# Patient Record
Sex: Female | Born: 1945 | Race: White | Hispanic: No | Smoking: Never smoker
Health system: Southern US, Community
[De-identification: ages and names within clinical notes are randomized; demographics above are authoritative.]

## PROBLEM LIST (undated history)

## (undated) DIAGNOSIS — I10 Essential (primary) hypertension: Secondary | ICD-10-CM

## (undated) DIAGNOSIS — E079 Disorder of thyroid, unspecified: Secondary | ICD-10-CM

## (undated) DIAGNOSIS — E7800 Pure hypercholesterolemia, unspecified: Secondary | ICD-10-CM

## (undated) DIAGNOSIS — E119 Type 2 diabetes mellitus without complications: Secondary | ICD-10-CM

## (undated) HISTORY — PX: ABDOMINAL HYSTERECTOMY: SHX81

## (undated) HISTORY — PX: APPENDECTOMY: SHX54

## (undated) HISTORY — PX: CHOLECYSTECTOMY: SHX55

---

## 2018-01-23 ENCOUNTER — Emergency Department (HOSPITAL_COMMUNITY): Payer: Medicare Other

## 2018-01-23 ENCOUNTER — Emergency Department (HOSPITAL_COMMUNITY)
Admission: EM | Admit: 2018-01-23 | Discharge: 2018-01-23 | Disposition: A | Discharge status: Home / Self Care | Payer: Medicare Other | Attending: Emergency Medicine | Admitting: Emergency Medicine

## 2018-01-23 ENCOUNTER — Encounter (HOSPITAL_COMMUNITY): Payer: Self-pay | Admitting: Emergency Medicine

## 2018-01-23 ENCOUNTER — Other Ambulatory Visit: Payer: Self-pay

## 2018-01-23 DIAGNOSIS — G4489 Other headache syndrome: Secondary | ICD-10-CM | POA: Diagnosis not present

## 2018-01-23 DIAGNOSIS — E119 Type 2 diabetes mellitus without complications: Secondary | ICD-10-CM | POA: Insufficient documentation

## 2018-01-23 DIAGNOSIS — I10 Essential (primary) hypertension: Secondary | ICD-10-CM | POA: Diagnosis not present

## 2018-01-23 DIAGNOSIS — R51 Headache: Secondary | ICD-10-CM | POA: Diagnosis present

## 2018-01-23 HISTORY — DX: Type 2 diabetes mellitus without complications: E11.9

## 2018-01-23 HISTORY — DX: Essential (primary) hypertension: I10

## 2018-01-23 HISTORY — DX: Pure hypercholesterolemia, unspecified: E78.00

## 2018-01-23 HISTORY — DX: Disorder of thyroid, unspecified: E07.9

## 2018-01-23 MED ORDER — HYDROCODONE-ACETAMINOPHEN 5-325 MG PO TABS
2.0000 | ORAL_TABLET | Freq: Once | ORAL | Status: AC
  Administered 2018-01-23: 2 via ORAL
  Filled 2018-01-23: qty 2

## 2018-01-23 NOTE — ED Notes (Signed)
Pt requesting a quiet place to wait and requesting results from CT.  Notified Dr. Effie ShyWentz and moved pt to family room for Dr. Effie ShyWentz to talk to pt.

## 2018-01-23 NOTE — ED Notes (Signed)
Pt was discharged by previous shift RN

## 2018-01-23 NOTE — ED Provider Notes (Signed)
[facility]GENCY DEPARTMENT Provider Note   CSN: [id number] Arrival date & time: 01/23/18  1628     History   Chief Complaint Chief Complaint  Patient presents with  . Headache    HPI Holly Berger is a 72 y.o. female.  Patient evaluated by me, at 20: 20 p.m., in the family room, at the request of nursing, because the patient stated she wanted to leave, without waiting for routine examination in an examination room.  CT ordered from triage, by me, after request by nursing because her physician sent the patient here for a CT of her head.  She has had a headache persistent for 1 week since upper endoscopy was done.  She is using Aleve, "every 4 hours," without relief.  She denies fever, nausea, vomiting, chest pain, weakness or dizziness.  She saw her doctor earlier today and was advised to come for a CAT scan, before treatment was initiated.  She came here by private vehicle with a friend.  Similar headaches in the past but never lasted this long.  There are no other known modifying factors.  HPI  Past Medical History:  Diagnosis Date  . Diabetes mellitus without complication (HCC)   . Hypercholesteremia   . Hypertension   . Thyroid disease     There are no active problems to display for this patient.   Past Surgical History:  Procedure Laterality Date  . ABDOMINAL HYSTERECTOMY    . APPENDECTOMY    . CHOLECYSTECTOMY      OB History    Gravida Para Term Preterm AB Living   2 2 2     1    SAB TAB Ectopic Multiple Live Births                   Home Medications    Prior to Admission medications   Not on File    Family History Family History  Problem Relation Age of Onset  . Cerebrovascular Disease Father     Social History Social History   Tobacco Use  . Smoking status: Never Smoker  . Smokeless tobacco: Never Used  Substance Use Topics  . Alcohol use: No    Frequency: Never  . Drug use: No     Allergies   Patient has no known  allergies.   Review of Systems Review of Systems  All other systems reviewed and are negative.    Physical Exam Updated Vital Signs BP (!) 155/74 (BP Location: Right Arm)   Pulse 76   Temp 98.2 F (36.8 C) (Oral)   Resp 18   Ht 5\' 11"  (1.803 m)   Wt 95.3 kg (210 lb)   SpO2 99%   BMI 29.29 kg/m   Physical Exam  Constitutional: She is oriented to person, place, and time. She appears well-developed and well-nourished. She does not appear ill.  HENT:  Head: Normocephalic and atraumatic.  Eyes: Conjunctivae and EOM are normal. Pupils are equal, round, and reactive to light.  Neck: Normal range of motion and phonation normal. Neck supple. No neck rigidity.  No meningismus  Cardiovascular: Normal rate.  Pulmonary/Chest: Effort normal. She exhibits no tenderness.  Musculoskeletal: Normal range of motion.  Neurological: She is alert and oriented to person, place, and time. She exhibits normal muscle tone.  Skin: Skin is warm and dry.  Psychiatric: She has a normal mood and affect. Her behavior is normal. Judgment and thought content normal.  Nursing note and vitals reviewed.    ED Treatments /  Results  Labs (all labs ordered are listed, but only abnormal results are displayed) Labs Reviewed - No data to display  EKG  EKG Interpretation None       Radiology Ct Head Wo Contrast  Result Date: 01/23/2018 CLINICAL DATA:  Headache EXAM: CT HEAD WITHOUT CONTRAST TECHNIQUE: Contiguous axial images were obtained from the base of the skull through the vertex without intravenous contrast. COMPARISON:  None. FINDINGS: Brain: No mass lesion, intraparenchymal hemorrhage or extra-axial collection. No evidence of acute cortical infarct. Brain parenchyma and CSF-containing spaces are normal for age. Vascular: No hyperdense vessel or unexpected calcification. Skull: Normal visualized skull base, calvarium and extracranial soft tissues. Sinuses/Orbits: No sinus fluid levels or advanced  mucosal thickening. No mastoid effusion. Normal orbits. IMPRESSION: Normal head CT. Electronically Signed   By: Deatra Robinson M.D.   On: 01/23/2018 18:15    Procedures Procedures (including critical care time)  Medications Ordered in ED Medications  HYDROcodone-acetaminophen (NORCO/VICODIN) 5-325 MG per tablet 2 tablet (not administered)     Initial Impression / Assessment and Plan / ED Course  I have reviewed the triage vital signs and the nursing notes.  Pertinent labs & imaging results that were available during my care of the patient were reviewed by me and considered in my medical decision making (see chart for details).      Patient Vitals for the past 24 hrs:  BP Temp Temp src Pulse Resp SpO2 Height Weight  01/23/18 1731 - - - - - - 5\' 11"  (1.803 m) 95.3 kg (210 lb)  01/23/18 1730 (!) 155/74 98.2 F (36.8 C) Oral 76 18 99 % - -     8:28 PM Reevaluation with update and discussion. After initial assessment and treatment, an updated evaluation reveals patient was offered a pain reliever and accepted it.  Findings discussed with patient and all questions were answered. Mancel Bale      Final Clinical Impressions(s) / ED Diagnoses   Final diagnoses:  Other headache syndrome   Is nonspecific headache, with reassuring evaluation.  Doubt intracranial tumor, doubt intracranial bleeding, doubt serious bacterial infection or metabolic instability.  Nursing Notes Reviewed/ Care Coordinated Applicable Imaging Reviewed Interpretation of Laboratory Data incorporated into ED treatment  The patient appears reasonably screened and/or stabilized for discharge and I doubt any other medical condition or other [facility] requiring further screening, evaluation, or treatment in the ED at this time prior to discharge.  Plan: Home Medications-continue routine medications; Home Treatments-rest, fluids; return here if the recommended treatment, does not improve the symptoms; Recommended follow  up-PCP follow-up as needed.   ED Discharge Orders    None       Mancel Bale, MD 01/23/18 2051

## 2018-01-23 NOTE — Discharge Instructions (Signed)
Your brain scan, was normal.  Call your doctor in the morning to arrange for further care and treatment.

## 2018-01-23 NOTE — ED Triage Notes (Signed)
Patient reports she had an endoscopic exam last Wednesday. Woke from the procedure with a headache, has had one since.

## 2018-08-07 ENCOUNTER — Encounter: Payer: Self-pay | Admitting: Gastroenterology

## 2018-08-13 IMAGING — CT CT HEAD W/O CM
3 series · 16 of 47 positions shown, 19 images · non-contrast
Comparison: None.

CLINICAL DATA: Headache

EXAM:
CT HEAD WITHOUT CONTRAST
TECHNIQUE: Contiguous axial images were obtained from the base of the skull
through the vertex without intravenous contrast.

[Series 2: head wo · axial · 0.45mm/px · z∈[-589,-459]mm · 10 of 32 slices shown, 13 images]
[im 3/32  brain]
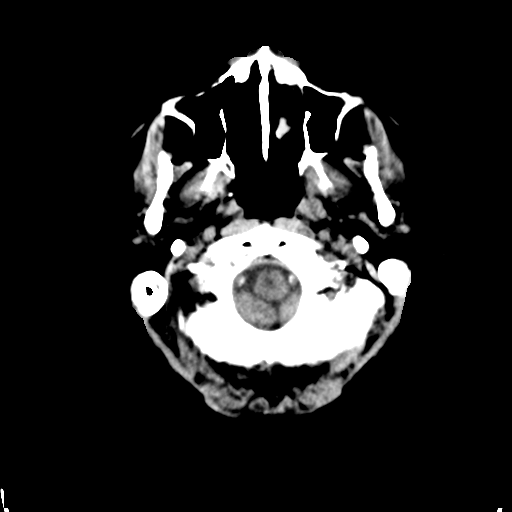
[im 3/32  bone]
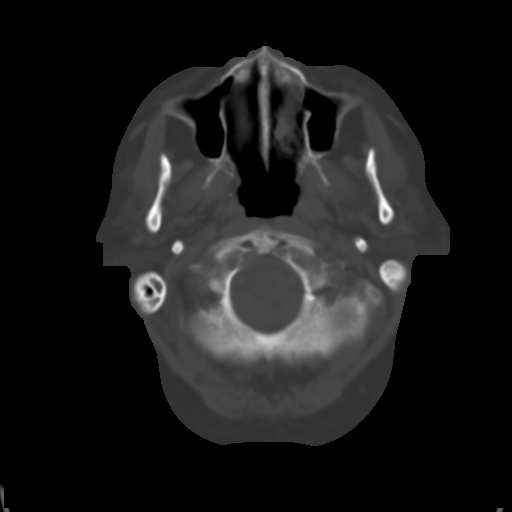
[im 6/32  brain]
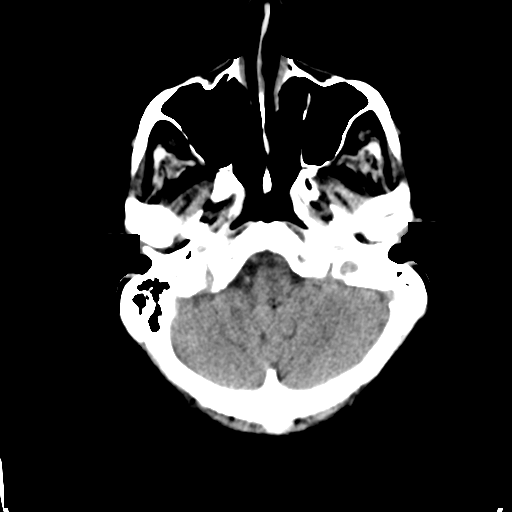
[im 9/32  brain]
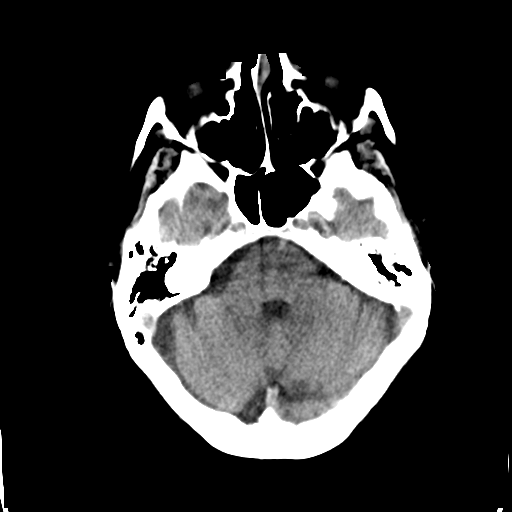
[im 11/32  brain]
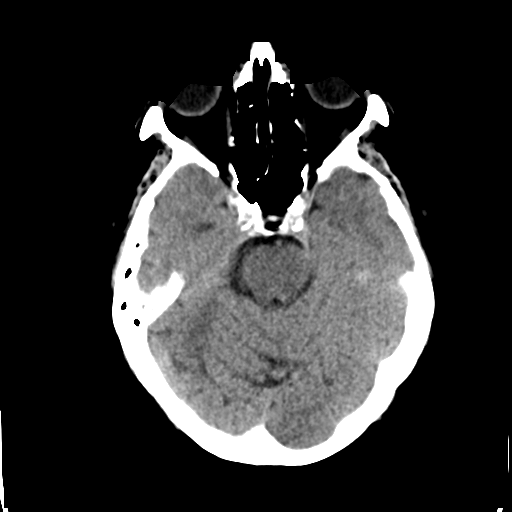
[im 14/32  brain]
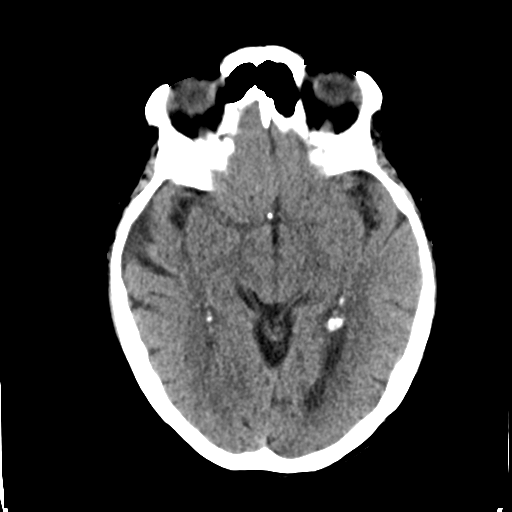
[im 14/32  bone]
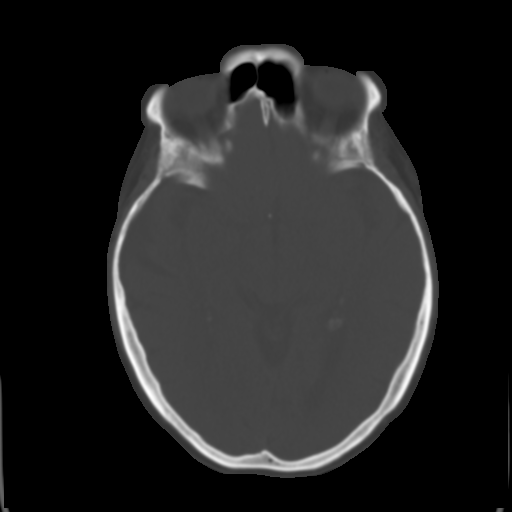
[im 18/32  brain]
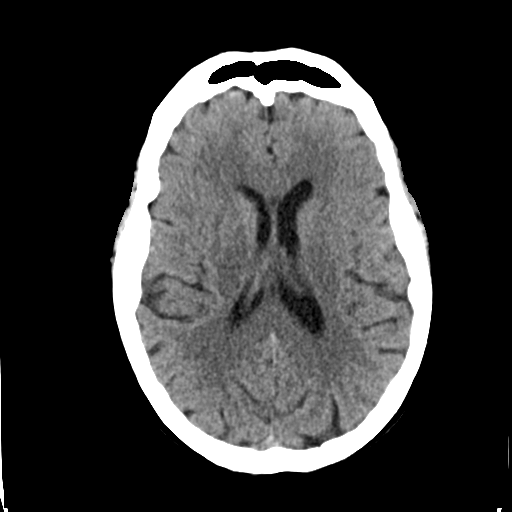
[im 21/32  brain]
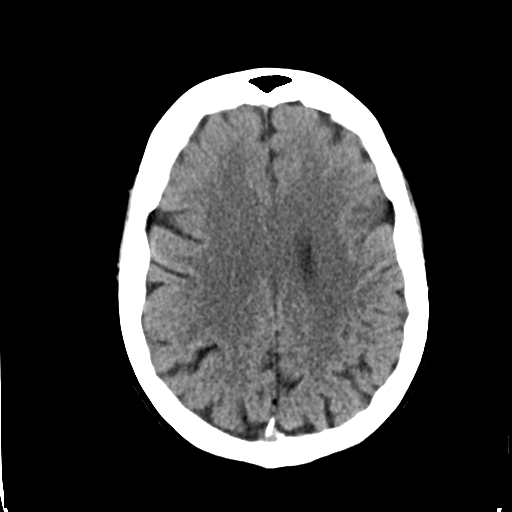
[im 24/32  brain]
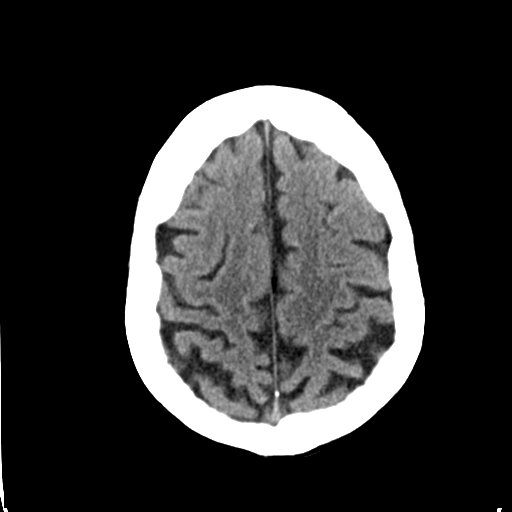
[im 26/32  brain]
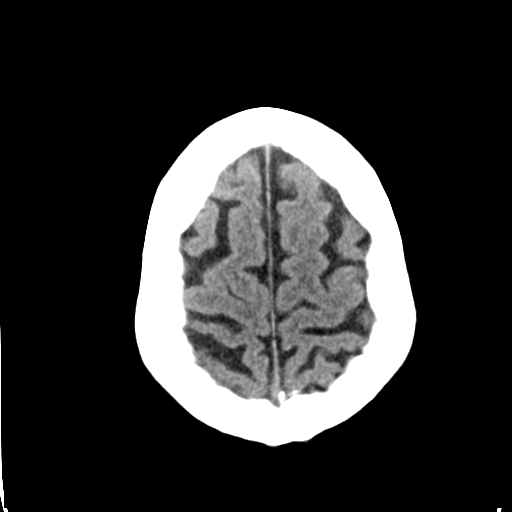
[im 26/32  bone]
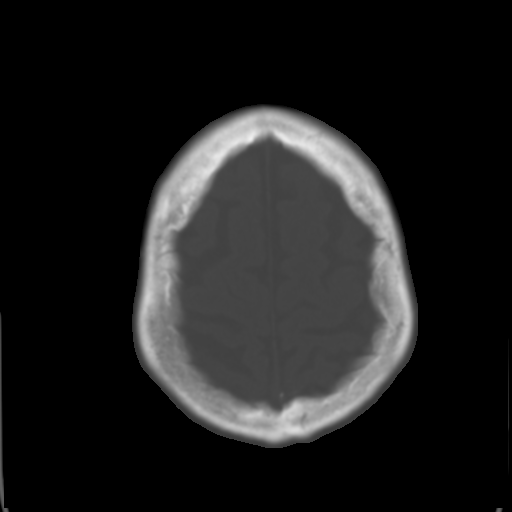
[im 29/32  brain]
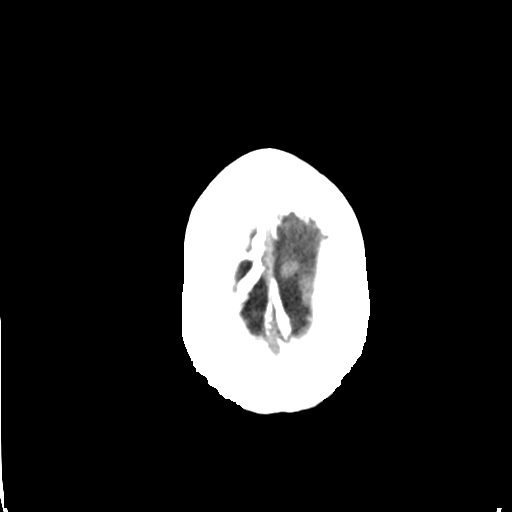

[Series 4: coronal soft tissue · coronal · 0.31mm/px · 3 of 71 slices shown]
[im 24/71  brain]
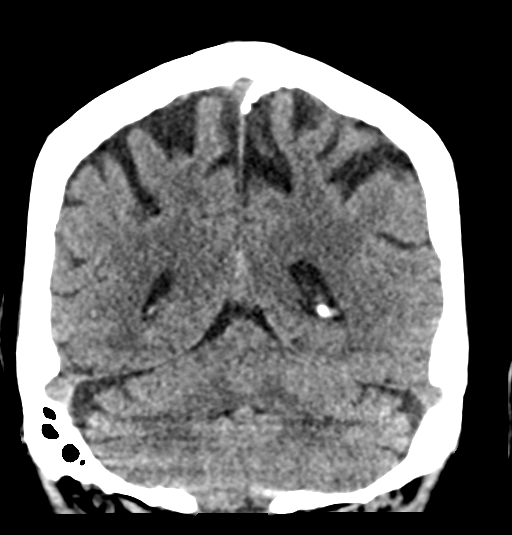
[im 32/71  brain]
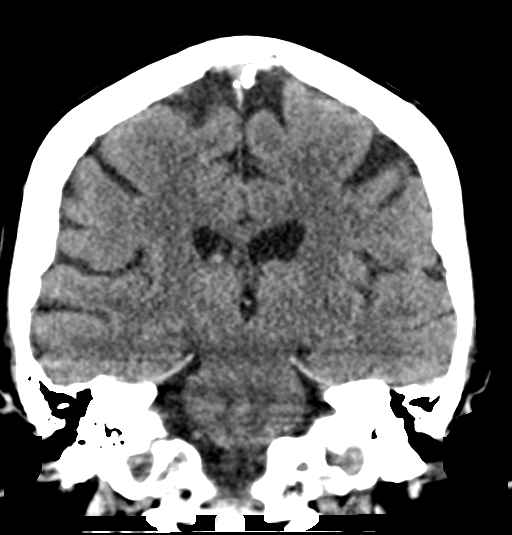
[im 39/71  brain]
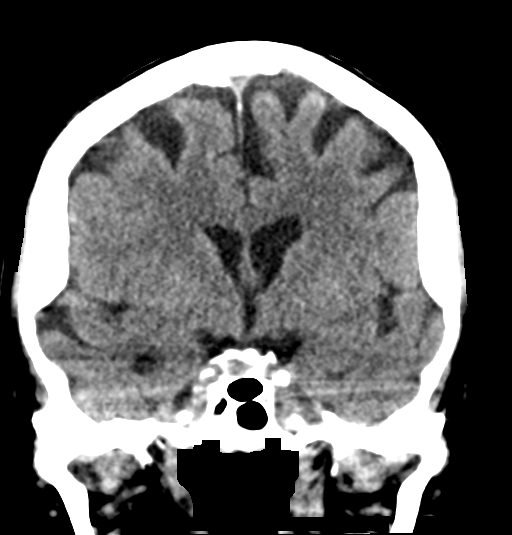

[Series 5: sagittal soft tissue · sagittal · 0.37mm/px · 3 of 53 slices shown]
[im 18/53  brain]
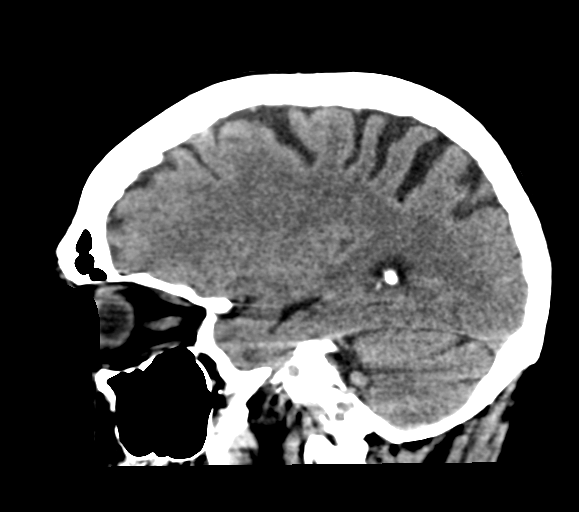
[im 27/53  brain]
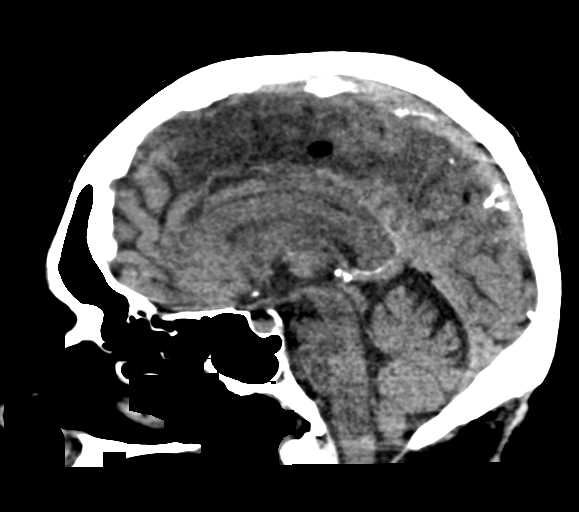
[im 35/53  brain]
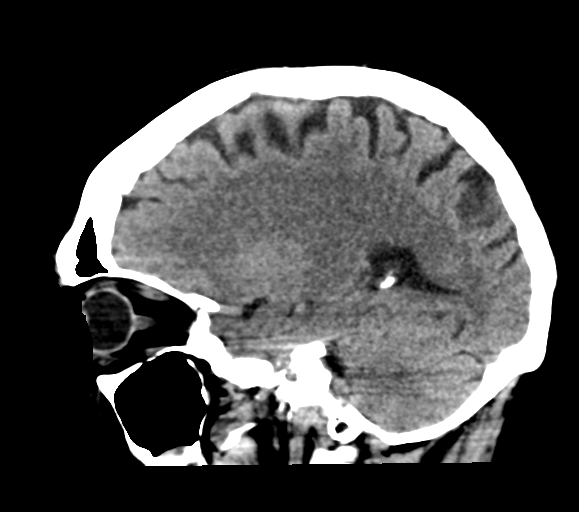

[16 of 47 positions shown; findings below may reference images not displayed]

FINDINGS: Brain: No mass lesion, intraparenchymal hemorrhage or extra-axial
collection. No evidence of acute cortical infarct. Brain parenchyma
and CSF-containing spaces are normal for age.

Vascular: No hyperdense vessel or unexpected calcification.

Skull: Normal visualized skull base, calvarium and extracranial soft
tissues.

Sinuses/Orbits: No sinus fluid levels or advanced mucosal
thickening. No mastoid effusion. Normal orbits.
IMPRESSION: Normal head CT.

## 2018-10-02 ENCOUNTER — Ambulatory Visit: Payer: Medicare Other | Admitting: Gastroenterology

## 2019-12-13 DEATH — deceased
